# Patient Record
Sex: Male | Born: 1980 | Race: White | Hispanic: No | Marital: Married | Smoking: Current every day smoker
Health system: Southern US, Community
[De-identification: ages and names within clinical notes are randomized; demographics above are authoritative.]

---

## 2005-10-18 ENCOUNTER — Ambulatory Visit: Payer: Self-pay | Admitting: Family Medicine

## 2007-03-27 ENCOUNTER — Emergency Department (HOSPITAL_COMMUNITY): Admission: EM | Admit: 2007-03-27 | Discharge: 2007-03-28 | Payer: Self-pay | Admitting: Emergency Medicine

## 2007-07-03 ENCOUNTER — Emergency Department (HOSPITAL_COMMUNITY): Admission: EM | Admit: 2007-07-03 | Discharge: 2007-07-03 | Payer: Self-pay | Admitting: Emergency Medicine

## 2007-07-31 ENCOUNTER — Emergency Department (HOSPITAL_COMMUNITY): Admission: EM | Admit: 2007-07-31 | Discharge: 2007-08-01 | Payer: Self-pay | Admitting: Emergency Medicine

## 2007-09-07 ENCOUNTER — Emergency Department (HOSPITAL_COMMUNITY): Admission: EM | Admit: 2007-09-07 | Discharge: 2007-09-07 | Payer: Self-pay | Admitting: Emergency Medicine

## 2007-09-20 ENCOUNTER — Emergency Department (HOSPITAL_COMMUNITY): Admission: EM | Admit: 2007-09-20 | Discharge: 2007-09-20 | Payer: Self-pay | Admitting: Emergency Medicine

## 2007-10-21 ENCOUNTER — Emergency Department (HOSPITAL_COMMUNITY): Admission: EM | Admit: 2007-10-21 | Discharge: 2007-10-21 | Payer: Self-pay | Admitting: Emergency Medicine

## 2007-11-01 ENCOUNTER — Emergency Department (HOSPITAL_COMMUNITY): Admission: EM | Admit: 2007-11-01 | Discharge: 2007-11-01 | Payer: Self-pay | Admitting: Emergency Medicine

## 2007-11-30 ENCOUNTER — Emergency Department (HOSPITAL_COMMUNITY): Admission: EM | Admit: 2007-11-30 | Discharge: 2007-12-01 | Payer: Self-pay | Admitting: Emergency Medicine

## 2007-12-13 ENCOUNTER — Emergency Department (HOSPITAL_COMMUNITY): Admission: EM | Admit: 2007-12-13 | Discharge: 2007-12-13 | Payer: Self-pay | Admitting: Emergency Medicine

## 2007-12-14 ENCOUNTER — Emergency Department (HOSPITAL_COMMUNITY): Admission: EM | Admit: 2007-12-14 | Discharge: 2007-12-14 | Payer: Self-pay | Admitting: Emergency Medicine

## 2007-12-23 ENCOUNTER — Emergency Department (HOSPITAL_COMMUNITY): Admission: EM | Admit: 2007-12-23 | Discharge: 2007-12-23 | Payer: Self-pay | Admitting: Emergency Medicine

## 2008-01-12 ENCOUNTER — Emergency Department (HOSPITAL_COMMUNITY): Admission: EM | Admit: 2008-01-12 | Discharge: 2008-01-12 | Payer: Self-pay | Admitting: Emergency Medicine

## 2008-02-16 ENCOUNTER — Emergency Department (HOSPITAL_COMMUNITY): Admission: EM | Admit: 2008-02-16 | Discharge: 2008-02-16 | Payer: Self-pay | Admitting: Emergency Medicine

## 2008-02-26 ENCOUNTER — Emergency Department (HOSPITAL_COMMUNITY): Admission: EM | Admit: 2008-02-26 | Discharge: 2008-02-26 | Payer: Self-pay | Admitting: Emergency Medicine

## 2008-06-28 ENCOUNTER — Emergency Department (HOSPITAL_COMMUNITY): Admission: EM | Admit: 2008-06-28 | Discharge: 2008-06-29 | Payer: Self-pay | Admitting: Emergency Medicine

## 2008-07-01 ENCOUNTER — Emergency Department (HOSPITAL_COMMUNITY): Admission: EM | Admit: 2008-07-01 | Discharge: 2008-07-01 | Payer: Self-pay | Admitting: Emergency Medicine

## 2008-09-27 ENCOUNTER — Emergency Department (HOSPITAL_COMMUNITY): Admission: EM | Admit: 2008-09-27 | Discharge: 2008-09-28 | Payer: Self-pay | Admitting: Emergency Medicine

## 2008-10-21 ENCOUNTER — Emergency Department (HOSPITAL_COMMUNITY): Admission: EM | Admit: 2008-10-21 | Discharge: 2008-10-21 | Payer: Self-pay | Admitting: Emergency Medicine

## 2008-10-24 ENCOUNTER — Emergency Department (HOSPITAL_COMMUNITY): Admission: EM | Admit: 2008-10-24 | Discharge: 2008-10-24 | Payer: Self-pay | Admitting: Emergency Medicine

## 2008-11-07 ENCOUNTER — Emergency Department (HOSPITAL_COMMUNITY): Admission: EM | Admit: 2008-11-07 | Discharge: 2008-11-07 | Payer: Self-pay | Admitting: Emergency Medicine

## 2008-11-17 ENCOUNTER — Emergency Department (HOSPITAL_COMMUNITY): Admission: EM | Admit: 2008-11-17 | Discharge: 2008-11-17 | Payer: Self-pay | Admitting: Emergency Medicine

## 2008-11-27 ENCOUNTER — Emergency Department (HOSPITAL_COMMUNITY): Admission: EM | Admit: 2008-11-27 | Discharge: 2008-11-27 | Payer: Self-pay | Admitting: Emergency Medicine

## 2008-11-29 ENCOUNTER — Emergency Department (HOSPITAL_COMMUNITY): Admission: EM | Admit: 2008-11-29 | Discharge: 2008-11-29 | Payer: Self-pay | Admitting: Emergency Medicine

## 2009-01-26 ENCOUNTER — Emergency Department (HOSPITAL_COMMUNITY): Admission: EM | Admit: 2009-01-26 | Discharge: 2009-01-26 | Payer: Self-pay | Admitting: Emergency Medicine

## 2009-01-29 ENCOUNTER — Emergency Department (HOSPITAL_COMMUNITY): Admission: EM | Admit: 2009-01-29 | Discharge: 2009-01-29 | Payer: Self-pay | Admitting: Emergency Medicine

## 2009-02-20 ENCOUNTER — Emergency Department (HOSPITAL_COMMUNITY): Admission: EM | Admit: 2009-02-20 | Discharge: 2009-02-20 | Payer: Self-pay | Admitting: Emergency Medicine

## 2009-06-25 ENCOUNTER — Emergency Department (HOSPITAL_COMMUNITY): Admission: EM | Admit: 2009-06-25 | Discharge: 2009-06-25 | Payer: Self-pay | Admitting: Emergency Medicine

## 2009-09-07 ENCOUNTER — Emergency Department (HOSPITAL_COMMUNITY): Admission: EM | Admit: 2009-09-07 | Discharge: 2009-09-07 | Payer: Self-pay | Admitting: Emergency Medicine

## 2010-12-07 ENCOUNTER — Emergency Department (HOSPITAL_COMMUNITY)
Admission: EM | Admit: 2010-12-07 | Discharge: 2010-12-08 | Disposition: A | Discharge status: Home / Self Care | Payer: BC Managed Care – PPO | Attending: Emergency Medicine | Admitting: Emergency Medicine

## 2010-12-07 DIAGNOSIS — K089 Disorder of teeth and supporting structures, unspecified: Secondary | ICD-10-CM | POA: Insufficient documentation

## 2010-12-07 DIAGNOSIS — K0381 Cracked tooth: Secondary | ICD-10-CM | POA: Insufficient documentation

## 2010-12-07 DIAGNOSIS — R220 Localized swelling, mass and lump, head: Secondary | ICD-10-CM | POA: Insufficient documentation

## 2010-12-07 DIAGNOSIS — R221 Localized swelling, mass and lump, neck: Secondary | ICD-10-CM | POA: Insufficient documentation

## 2010-12-07 DIAGNOSIS — K047 Periapical abscess without sinus: Secondary | ICD-10-CM | POA: Insufficient documentation

## 2011-01-17 IMAGING — CR DG SHOULDER 2+V*L*
3 series · 3 of 3 positions shown · non-contrast
Comparison: None

CLINICAL DATA: Go-kart accident.  Left shoulder pain.

LEFT SHOULDER - 2+ VIEW

[view not recorded (1 of 3)]
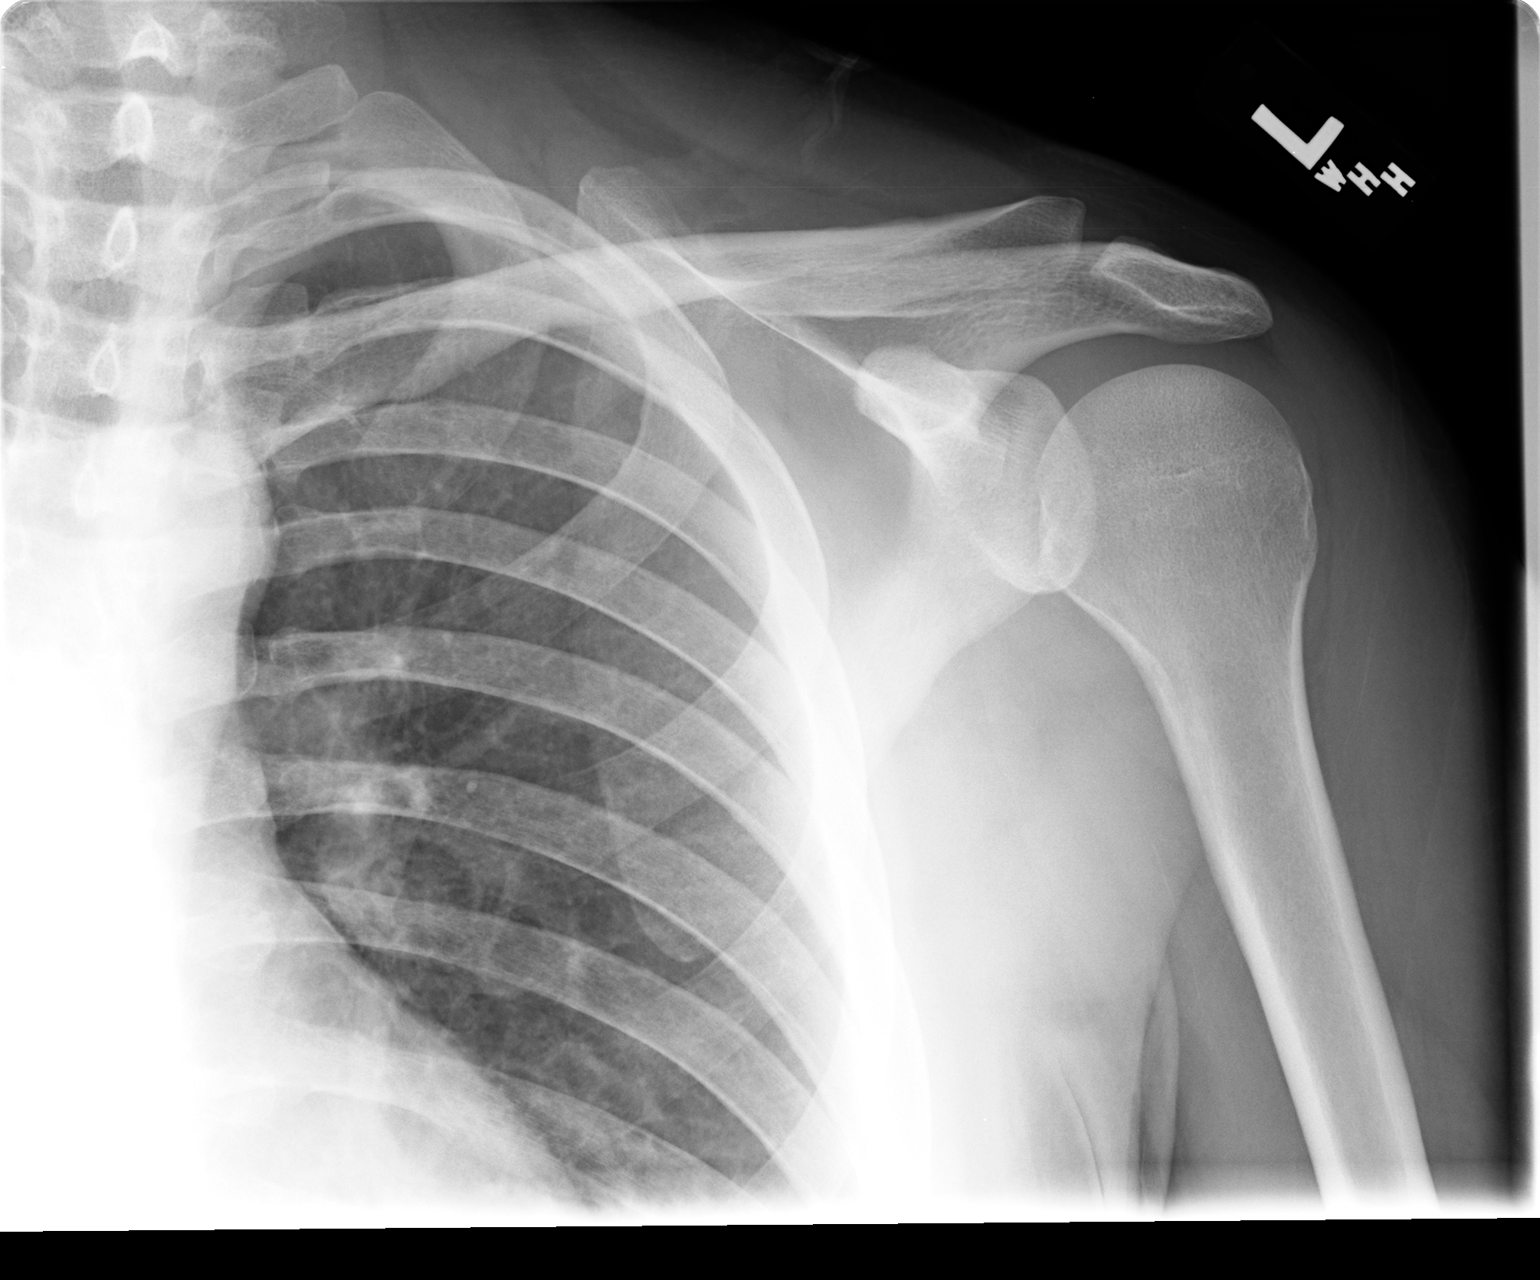

[view not recorded (2 of 3)]
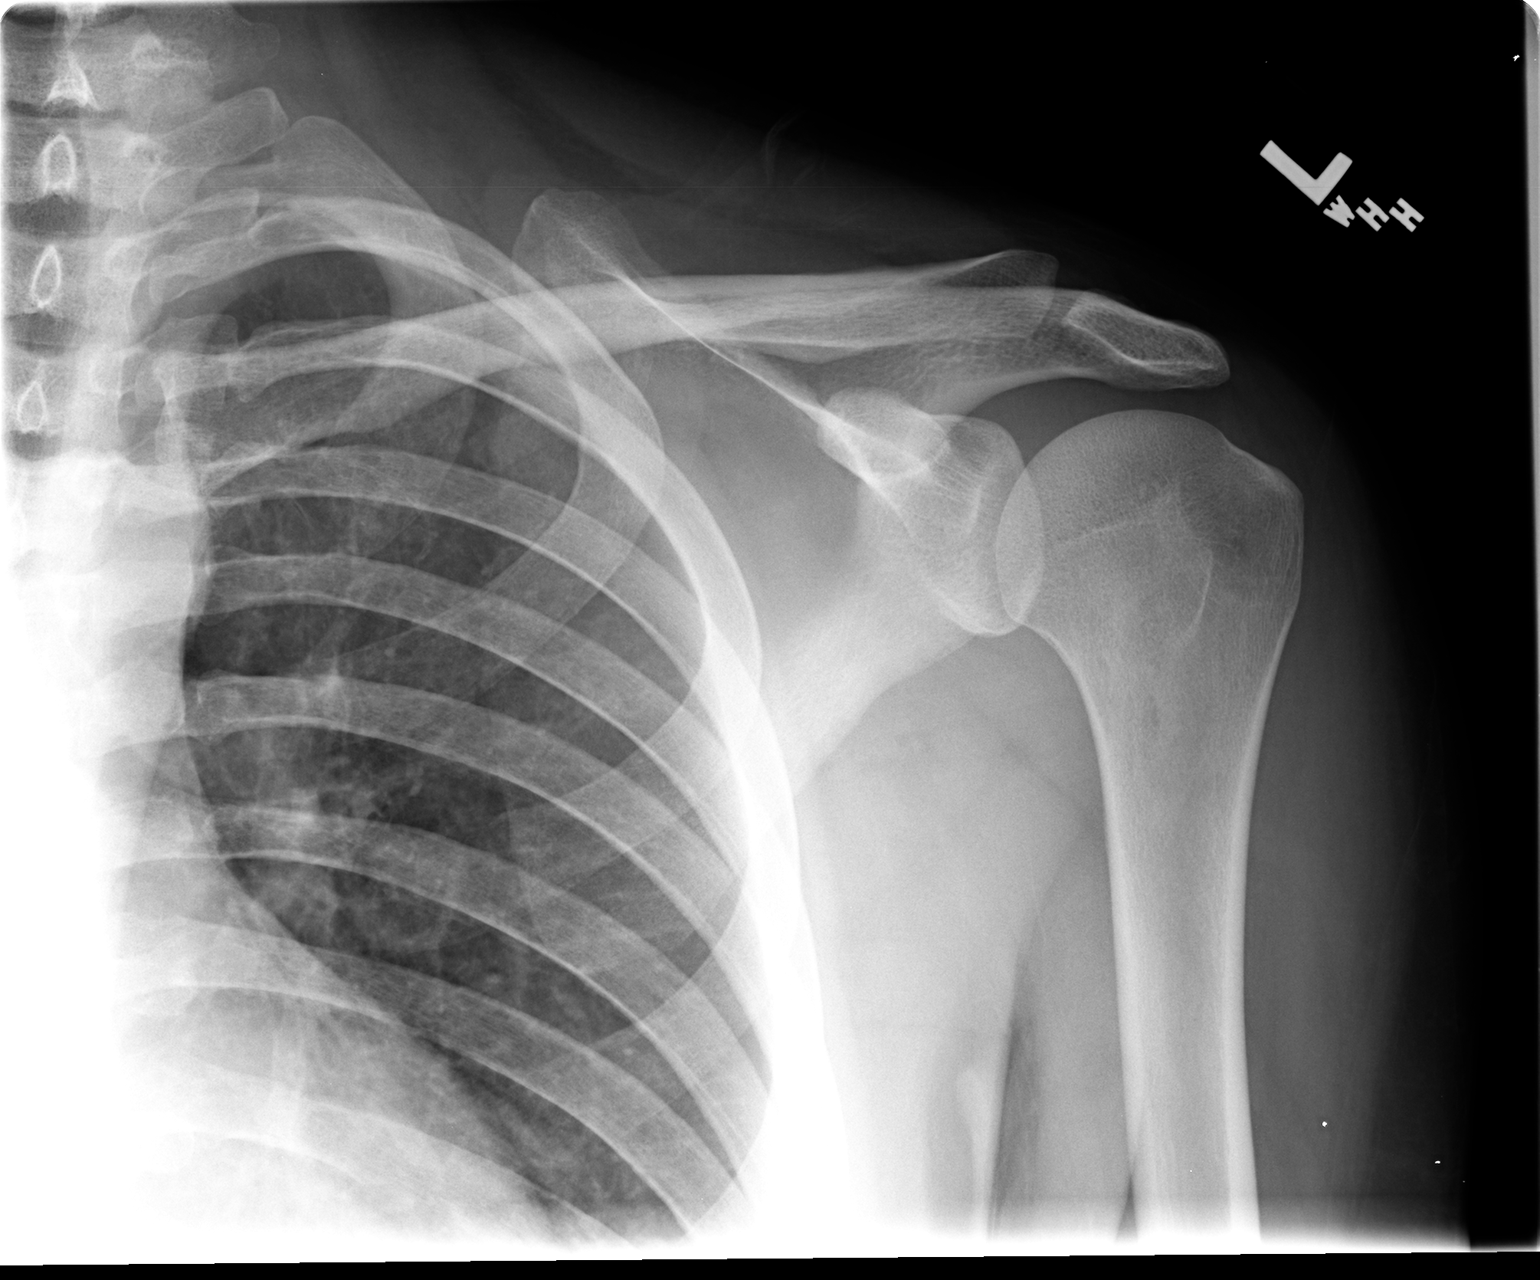

[view not recorded (3 of 3)]
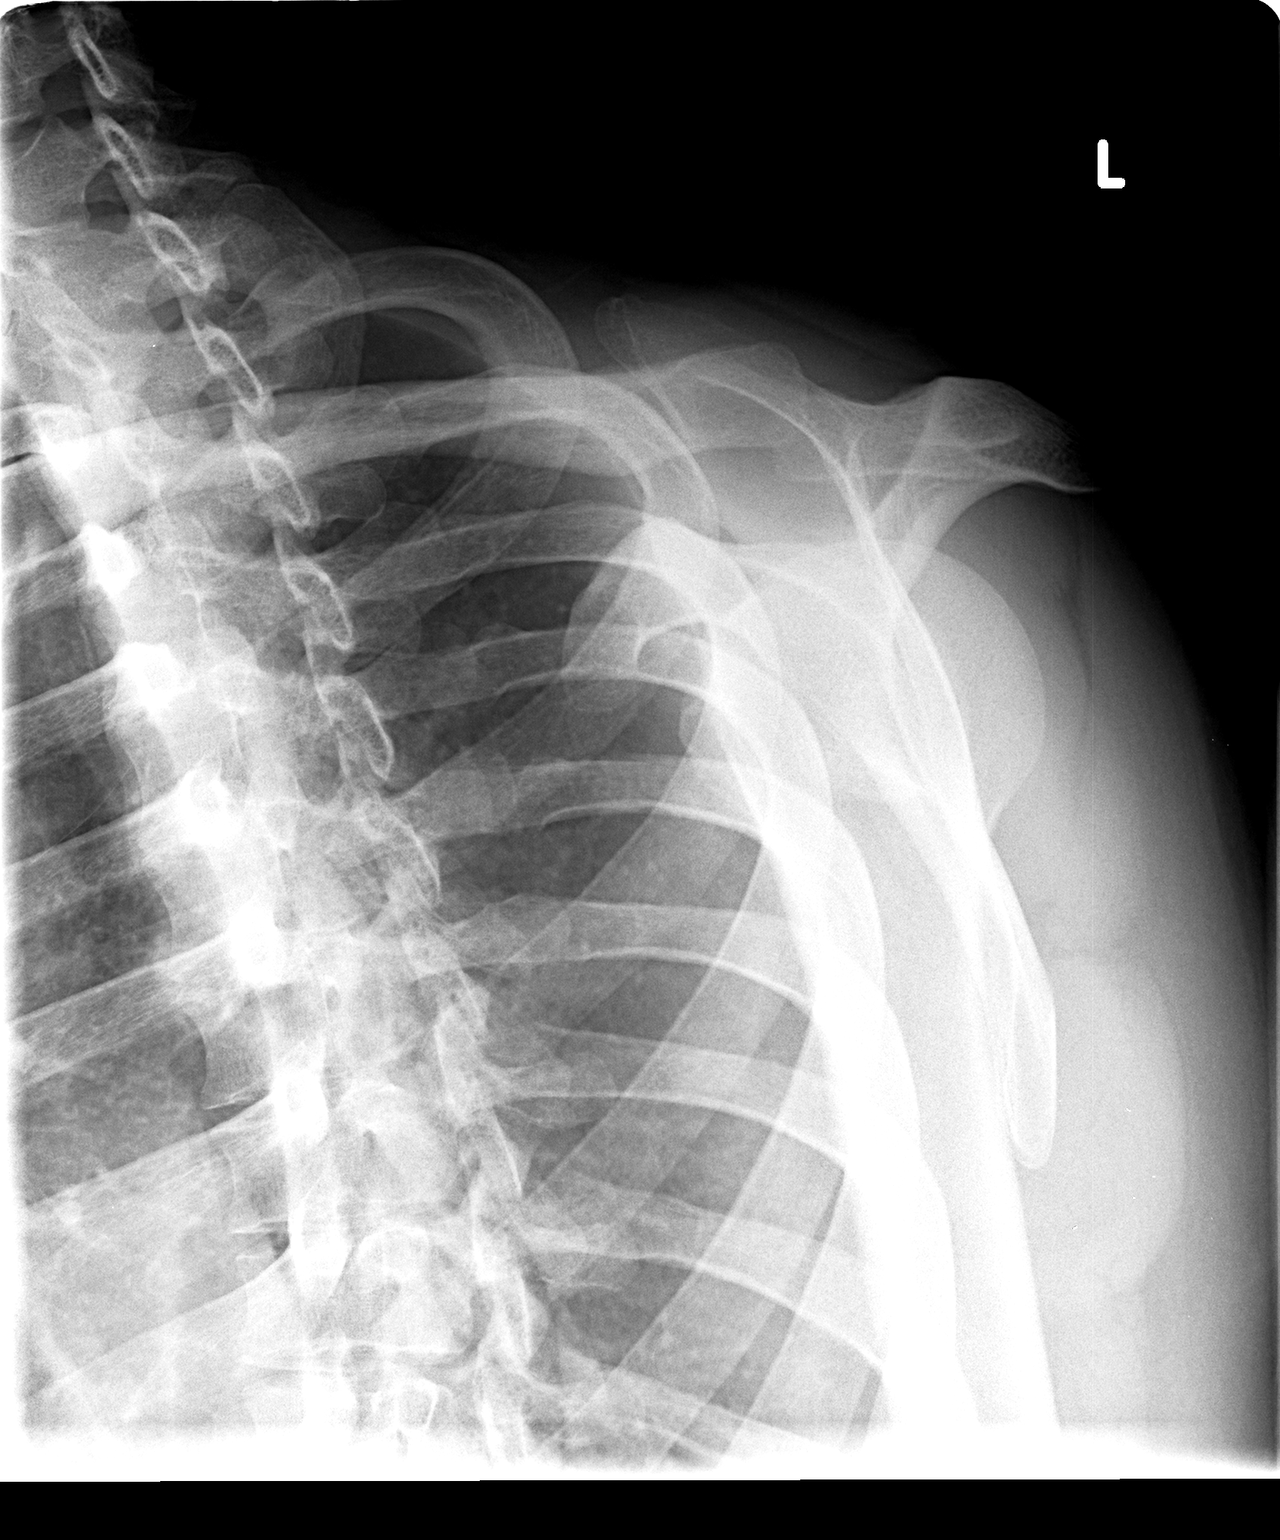

[3 of 3 positions shown; findings below may reference images not displayed]

FINDINGS: No fracture, foreign body, or acute bony findings are
identified. No dislocation evident.
IMPRESSION: 1.  No acute findings.

## 2011-01-17 IMAGING — CT CT HEAD W/O CM
1 series · 16 of 30 positions shown, 20 images · non-contrast
Comparison: none

CLINICAL DATA: Football injury.  Injury to the head.  Headache.

CT HEAD WITHOUT CONTRAST
TECHNIQUE: Contiguous axial images were obtained from the base of
the skull through the vertex without contrast.

[Series 2: headtrauma 4.8 h37s · axial · 0.46mm/px · z∈[+1085,+1246]mm · 16 of 36 slices shown, 20 images]
[im 2/36  brain]
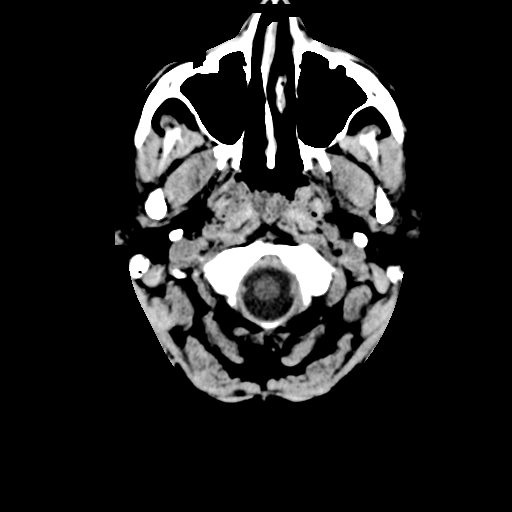
[im 2/36  bone]
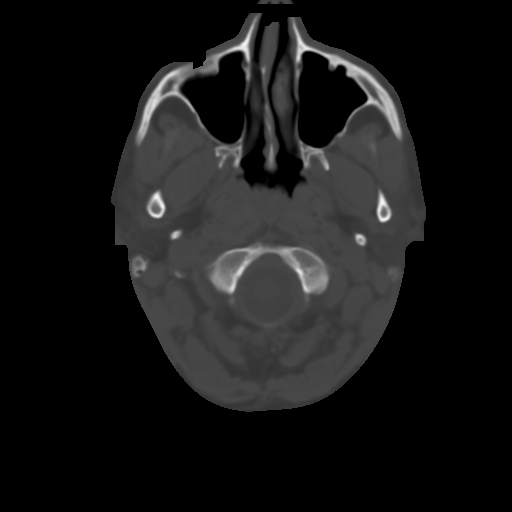
[im 4/36  brain]
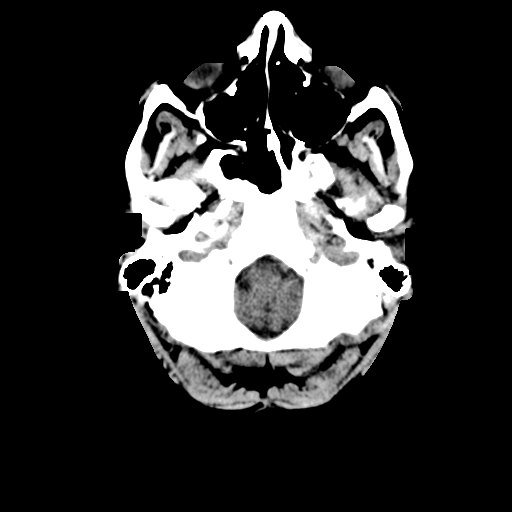
[im 7/36  brain]
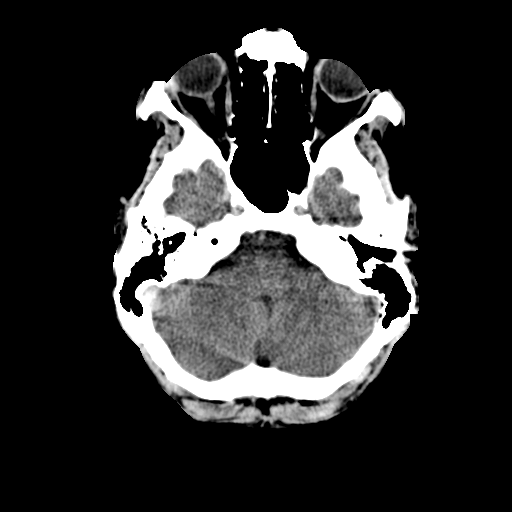
[im 9/36  brain]
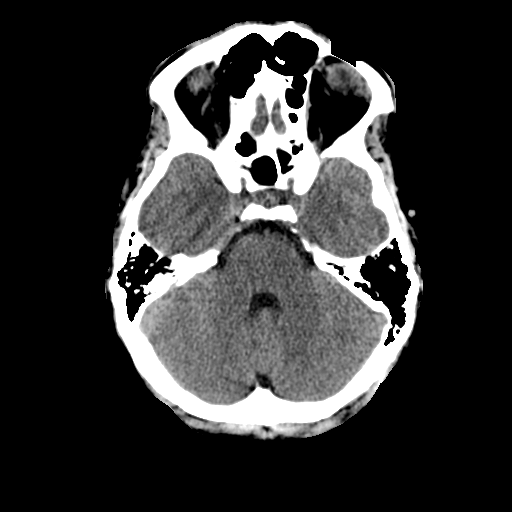
[im 10/36  brain]
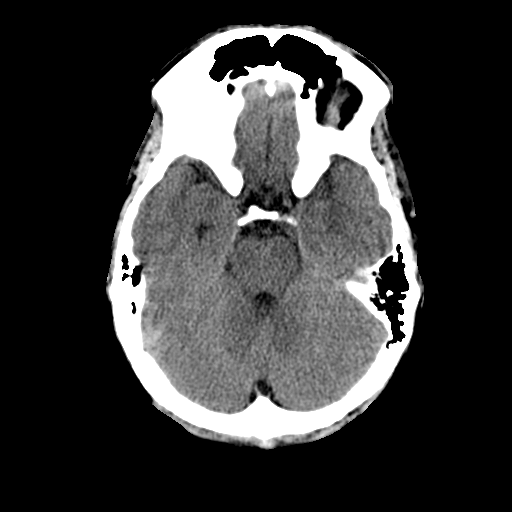
[im 10/36  bone]
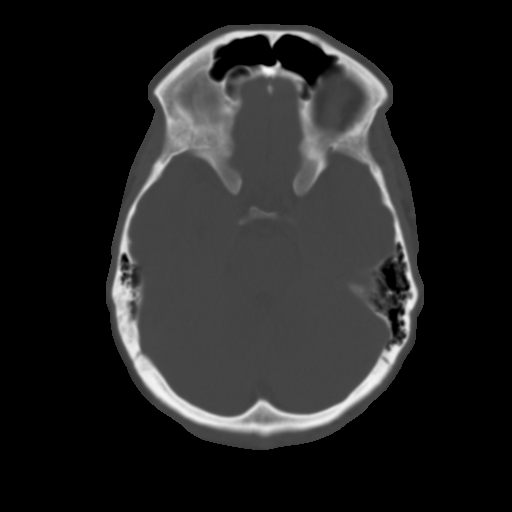
[im 13/36  brain]
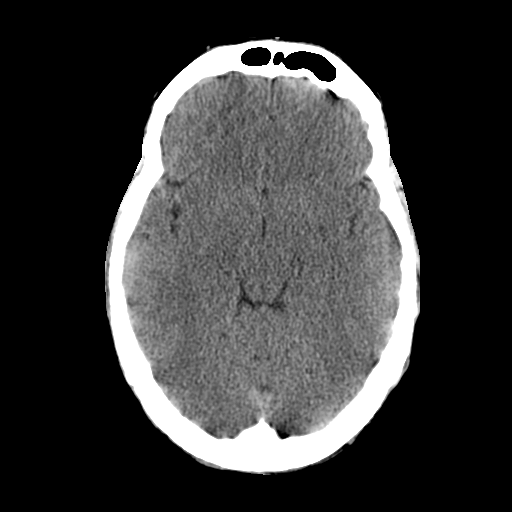
[im 15/36  brain]
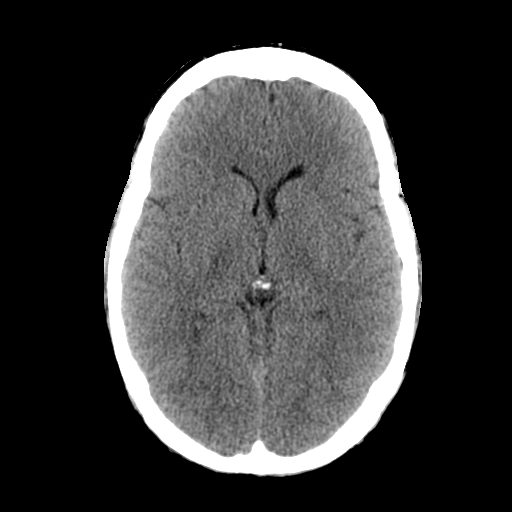
[im 17/36  brain]
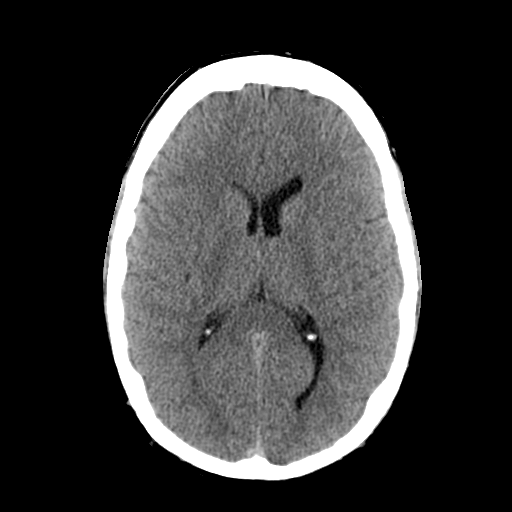
[im 19/36  brain]
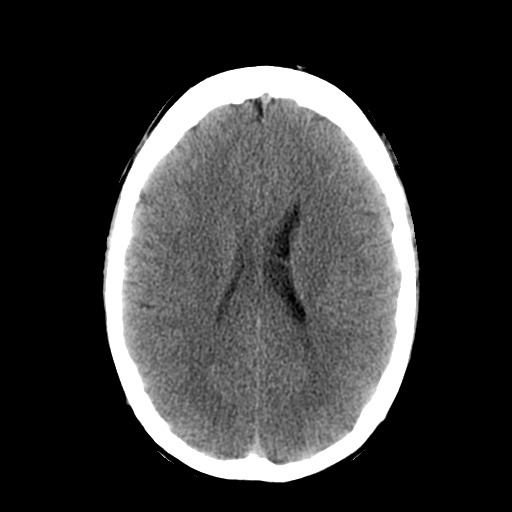
[im 19/36  bone]
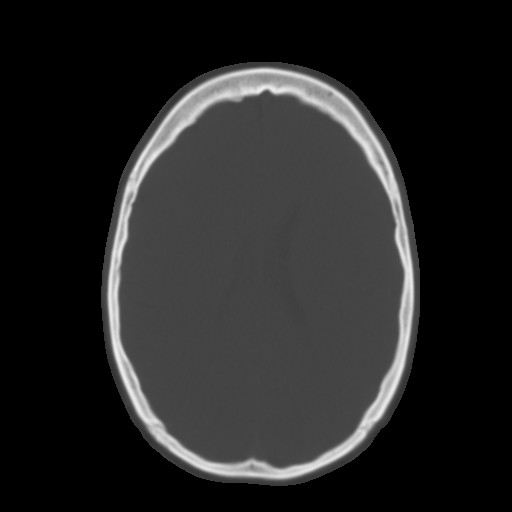
[im 21/36  brain]
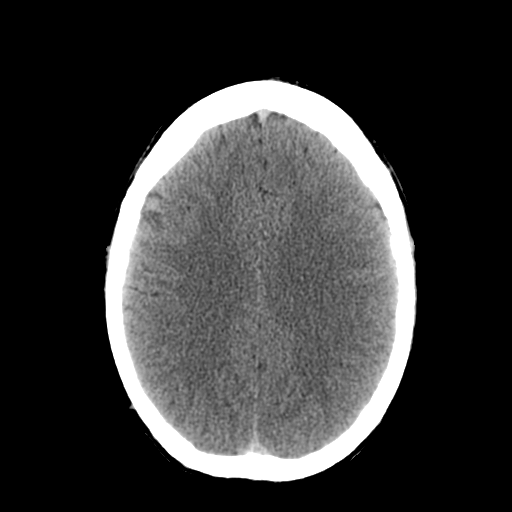
[im 23/36  brain]
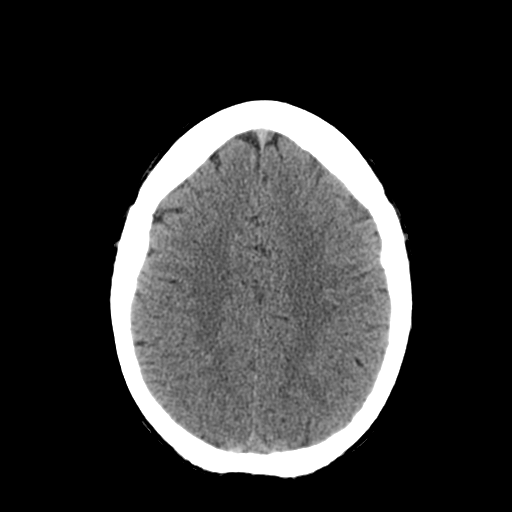
[im 26/36  brain]
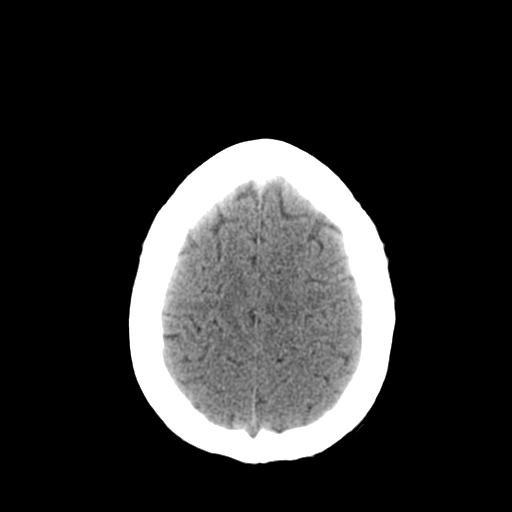
[im 27/36  brain]
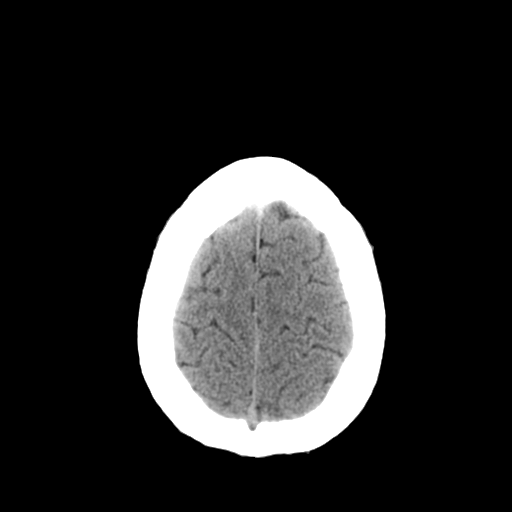
[im 27/36  bone]
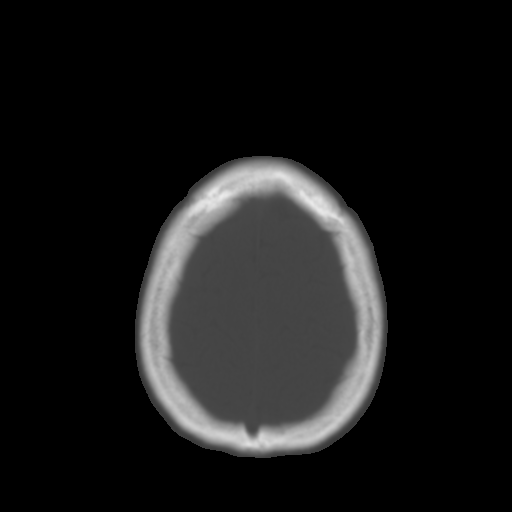
[im 29/36  brain]
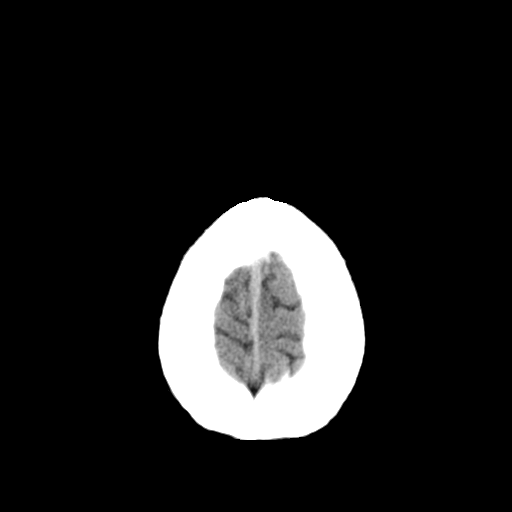
[im 32/36  brain]
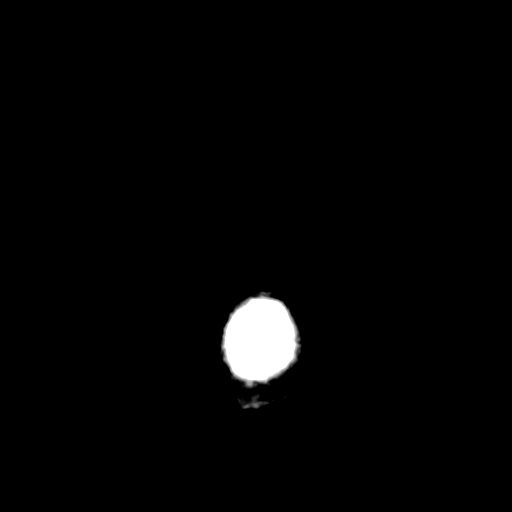
[im 34/36  brain]
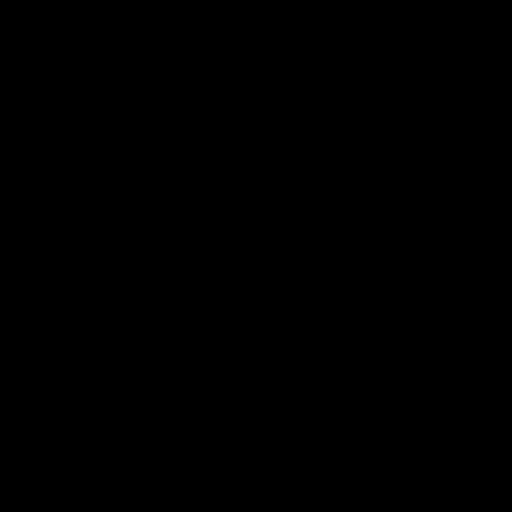

[16 of 30 positions shown; findings below may reference images not displayed]

FINDINGS: The brain stem, cerebellum, cerebral peduncles, thalami,
basal ganglia, basilar cisterns, and ventricular system appear
unremarkable.

No intracranial hemorrhage, mass lesion, or acute infarction is
identified.
IMPRESSION: 1.  No acute intracranial findings.

## 2011-05-06 LAB — CULTURE, ROUTINE-ABSCESS

## 2013-01-25 ENCOUNTER — Encounter (HOSPITAL_COMMUNITY): Payer: Self-pay | Admitting: *Deleted

## 2013-01-25 ENCOUNTER — Emergency Department (HOSPITAL_COMMUNITY)
Admission: EM | Admit: 2013-01-25 | Discharge: 2013-01-25 | Disposition: A | Discharge status: Home / Self Care | Payer: BC Managed Care – PPO | Attending: Emergency Medicine | Admitting: Emergency Medicine

## 2013-01-25 DIAGNOSIS — Y9289 Other specified places as the place of occurrence of the external cause: Secondary | ICD-10-CM | POA: Insufficient documentation

## 2013-01-25 DIAGNOSIS — M25559 Pain in unspecified hip: Secondary | ICD-10-CM | POA: Insufficient documentation

## 2013-01-25 DIAGNOSIS — F17200 Nicotine dependence, unspecified, uncomplicated: Secondary | ICD-10-CM | POA: Insufficient documentation

## 2013-01-25 DIAGNOSIS — Y939 Activity, unspecified: Secondary | ICD-10-CM | POA: Insufficient documentation

## 2013-01-25 DIAGNOSIS — Y990 Civilian activity done for income or pay: Secondary | ICD-10-CM | POA: Insufficient documentation

## 2013-01-25 DIAGNOSIS — W92XXXA Exposure to excessive heat of man-made origin, initial encounter: Secondary | ICD-10-CM | POA: Insufficient documentation

## 2013-01-25 DIAGNOSIS — R5383 Other fatigue: Secondary | ICD-10-CM

## 2013-01-25 DIAGNOSIS — R5381 Other malaise: Secondary | ICD-10-CM | POA: Insufficient documentation

## 2013-01-25 DIAGNOSIS — T675XXA Heat exhaustion, unspecified, initial encounter: Secondary | ICD-10-CM | POA: Insufficient documentation

## 2013-01-25 LAB — CBC WITH DIFFERENTIAL/PLATELET
Hemoglobin: 14.1 g/dL (ref 13.0–17.0)
Lymphocytes Relative: 18 % (ref 12–46)
Lymphs Abs: 1.6 10*3/uL (ref 0.7–4.0)
Monocytes Relative: 3 % (ref 3–12)
Neutro Abs: 6.6 10*3/uL (ref 1.7–7.7)
Neutrophils Relative %: 78 % — ABNORMAL HIGH (ref 43–77)
RBC: 4.57 MIL/uL (ref 4.22–5.81)
WBC: 8.5 10*3/uL (ref 4.0–10.5)

## 2013-01-25 LAB — COMPREHENSIVE METABOLIC PANEL
ALT: 17 U/L (ref 0–53)
Albumin: 4 g/dL (ref 3.5–5.2)
Alkaline Phosphatase: 66 U/L (ref 39–117)
BUN: 13 mg/dL (ref 6–23)
Chloride: 103 mEq/L (ref 96–112)
GFR calc Af Amer: >90 mL/min (ref >90)
Glucose, Bld: 121 mg/dL — ABNORMAL HIGH (ref 70–99)
Potassium: 3.2 mEq/L — ABNORMAL LOW (ref 3.5–5.1)
Total Bilirubin: 0.3 mg/dL (ref 0.3–1.2)

## 2013-01-25 LAB — URINALYSIS, ROUTINE W REFLEX MICROSCOPIC
Bilirubin Urine: NEGATIVE
Glucose, UA: NEGATIVE mg/dL
Ketones, ur: NEGATIVE mg/dL
Leukocytes, UA: NEGATIVE
Protein, ur: NEGATIVE mg/dL

## 2013-01-25 MED ORDER — SODIUM CHLORIDE 0.9 % IV BOLUS (SEPSIS)
1000.0000 mL | Freq: Once | INTRAVENOUS | Status: AC
  Administered 2013-01-25: 1000 mL via INTRAVENOUS

## 2013-01-25 NOTE — ED Notes (Signed)
Patient states he does not need anything at this time. 

## 2013-01-25 NOTE — ED Provider Notes (Signed)
History  This chart was scribed for Eric Roller, MD by Manuela Schwartz, ED scribe. This patient was seen in room [facility]12/[facility]12 and the patient's care was started at 1819.   CSN: [id number]  Arrival date & time 01/25/13  1819   First MD Initiated Contact with Patient 01/25/13 1839      Chief Complaint  Patient presents with  . Weakness  . Hip Pain   Patient is a 32 y.o. male presenting with weakness and hip pain. The history is provided by the patient. No language interpreter was used.  Weakness This is a new problem. The current episode started more than 2 days ago. The problem occurs constantly. The problem has been gradually worsening. Pertinent negatives include no chest pain, no abdominal pain and no shortness of breath. The symptoms are aggravated by stress. Nothing relieves the symptoms. He has tried rest for the symptoms. The treatment provided no relief.  Hip Pain Pertinent negatives include no chest pain, no abdominal pain and no shortness of breath.  HPI Comments: Eric Meyer is a 32 y.o. male who presents to the Emergency Department complaining of constant gradually worsening generalized weakness/fatigue and mild left hip pain. He states works in [employer] w/out a/c and hot/sweating all the time and feeling dehydrated. He missed work the past two days due to fatigue and slept all day yesterday. He c/o left hip pain but denies any recent trauma to his hip. He denies any changes to BMs. He is a smoker and denies illicit drug use. He denies any recent regular medicine changes.    History reviewed. No pertinent past medical history.  History reviewed. No pertinent past surgical history.  History reviewed. No pertinent family history.  History  Substance Use Topics  . Smoking status: Current Every Day Smoker  . Smokeless tobacco: Not on file  . Alcohol Use: No      Review of Systems  Constitutional: Positive for fatigue. Negative for fever, chills and appetite change.   Respiratory: Negative for shortness of breath.   Cardiovascular: Negative for chest pain.  Gastrointestinal: Negative for nausea, vomiting, abdominal pain and diarrhea.  Musculoskeletal:       Left hip pain  Neurological: Negative for weakness.  All other systems reviewed and are negative.    Allergies  Review of patient's allergies indicates no known allergies.  Home Medications   Current Outpatient Rx  Name  Route  Sig  Dispense  Refill  . ibuprofen (ADVIL,MOTRIN) 200 MG tablet   Oral   Take 800 mg by mouth every 6 (six) hours as needed for pain.           Triage Vitals: BP 154/83  Pulse 95  Temp(Src) 99.3 F (37.4 C) (Oral)  Resp 20  Ht 5\' 5"  (1.651 m)  Wt 166 lb (75.297 kg)  BMI 27.62 kg/m2  SpO2 100%  Physical Exam  Nursing note and vitals reviewed. Constitutional: He is oriented to person, place, and time. He appears well-developed and well-nourished. No distress.  HENT:  Head: Normocephalic and atraumatic.  Dry mucous membranes  Eyes: EOM are normal.  Neck: Neck supple. No tracheal deviation present.  Cardiovascular: Normal rate.   Pulmonary/Chest: Effort normal. No respiratory distress.  Musculoskeletal: Normal range of motion.  Neurological: He is alert and oriented to person, place, and time.  Skin: Skin is warm and dry.  Psychiatric: He has a normal mood and affect. His behavior is normal.    ED Course  Procedures (including critical  care time) DIAGNOSTIC STUDIES: Oxygen Saturation is 100% on room air, normal by my interpretation.    COORDINATION OF CARE: At 655 PM Discussed treatment plan with patient which includes IV fluids, blood work. Patient agrees.   Labs Reviewed  CBC WITH DIFFERENTIAL - Abnormal; Notable for the following:    Platelets 147 (*)    Neutrophils Relative % 78 (*)    All other components within normal limits  COMPREHENSIVE METABOLIC PANEL - Abnormal; Notable for the following:    Potassium 3.2 (*)    Glucose, Bld 121  (*)    All other components within normal limits  URINALYSIS, ROUTINE W REFLEX MICROSCOPIC - Abnormal; Notable for the following:    Specific Gravity, Urine <1.005 (*)    All other components within normal limits  CK - Abnormal; Notable for the following:    Total CK 810 (*)    All other components within normal limits   No results found.   1. Fatigue   2. Heat exhaustion, initial encounter       MDM  Pt well appearing, no focal neuro def, normal strenght, normal VS, no fever, soft abd, clear lungs and no rashes.  Labs unremarkable - fluids given, pt table for d/c.  Labs unremarkable.   I personally performed the services described in this documentation, which was scribed in my presence. The recorded information has been reviewed and is accurate.  Meds given in ED:  Medications  sodium chloride 0.9 % bolus 1,000 mL (1,000 mLs Intravenous New Bag/Given 01/25/13 1930)  sodium chloride 0.9 % bolus 1,000 mL (0 mLs Intravenous Stopped 01/25/13 2028)    New Prescriptions   No medications on file             Eric Roller, MD 01/25/13 2123

## 2013-01-25 NOTE — ED Notes (Signed)
MD at bedside. 

## 2013-01-25 NOTE — ED Notes (Signed)
Pt states all he wants to do is sleep. He has been asleep since yesterday morning. Pt has decreased apetitte but has been taking fluids. Pt also c/o left hip pain.

## 2014-10-25 ENCOUNTER — Emergency Department (HOSPITAL_COMMUNITY)
Admission: EM | Admit: 2014-10-25 | Discharge: 2014-10-25 | Disposition: A | Discharge status: Home / Self Care | Payer: PRIVATE HEALTH INSURANCE | Attending: Emergency Medicine | Admitting: Emergency Medicine

## 2014-10-25 ENCOUNTER — Encounter (HOSPITAL_COMMUNITY): Payer: Self-pay | Admitting: *Deleted

## 2014-10-25 DIAGNOSIS — R000 Tachycardia, unspecified: Secondary | ICD-10-CM | POA: Insufficient documentation

## 2014-10-25 DIAGNOSIS — Z720 Tobacco use: Secondary | ICD-10-CM | POA: Diagnosis not present

## 2014-10-25 DIAGNOSIS — K088 Other specified disorders of teeth and supporting structures: Secondary | ICD-10-CM | POA: Diagnosis present

## 2014-10-25 DIAGNOSIS — K029 Dental caries, unspecified: Secondary | ICD-10-CM | POA: Diagnosis not present

## 2014-10-25 MED ORDER — AMOXICILLIN 250 MG PO CAPS
500.0000 mg | ORAL_CAPSULE | Freq: Once | ORAL | Status: AC
  Administered 2014-10-25: 500 mg via ORAL
  Filled 2014-10-25: qty 2

## 2014-10-25 MED ORDER — IBUPROFEN 800 MG PO TABS: 800.0000 mg | ORAL_TABLET | Freq: Three times a day (TID) | ORAL | Status: AC

## 2014-10-25 MED ORDER — IBUPROFEN 800 MG PO TABS
800.0000 mg | ORAL_TABLET | Freq: Once | ORAL | Status: AC
  Administered 2014-10-25: 800 mg via ORAL
  Filled 2014-10-25: qty 1

## 2014-10-25 MED ORDER — ACETAMINOPHEN 325 MG PO TABS
650.0000 mg | ORAL_TABLET | Freq: Once | ORAL | Status: AC
  Administered 2014-10-25: 650 mg via ORAL
  Filled 2014-10-25: qty 2

## 2014-10-25 MED ORDER — AMOXICILLIN 500 MG PO CAPS: 500.0000 mg | ORAL_CAPSULE | Freq: Three times a day (TID) | ORAL | Status: DC

## 2014-10-25 NOTE — ED Notes (Signed)
Pt alert & oriented x4, stable gait. Patient given discharge instructions, paperwork & prescription(s). Patient  instructed to stop at the registration desk to finish any additional paperwork. Patient verbalized understanding. Pt left department w/ no further questions. 

## 2014-10-25 NOTE — Discharge Instructions (Signed)
Dental Caries °Dental caries is tooth decay. This decay can cause a hole in teeth (cavity) that can get bigger and deeper over time. °HOME CARE °· Brush and floss your teeth. Do this at least two times a day. °· Use a fluoride toothpaste. °· Use a mouth rinse if told by your dentist or doctor. °· Eat less sugary and starchy foods. Drink less sugary drinks. °· Avoid snacking often on sugary and starchy foods. Avoid sipping often on sugary drinks. °· Keep regular checkups and cleanings with your dentist. °· Use fluoride supplements if told by your dentist or doctor. °· Allow fluoride to be applied to teeth if told by your dentist or doctor. °Document Released: 05/04/2008 Document Revised: 12/10/2013 Document Reviewed: 07/28/2012 °[employer]® Patient Information ©2015 [employer]. This information is not intended to replace advice given to you by your health care provider. Make sure you discuss any questions you have with your health care provider. ° °

## 2014-10-25 NOTE — ED Notes (Signed)
Broke bottom left tooth yesterday.  Pain 5/10 since.  Has taken ibuprofen and tylenol.

## 2014-10-25 NOTE — ED Provider Notes (Signed)
CSN: [id number]     Arrival date & time 10/25/14  1707 History   First MD Initiated Contact with Patient 10/25/14 1833     Chief Complaint  Patient presents with  . Dental Pain     (Consider location/radiation/quality/duration/timing/severity/associated sxs/prior Treatment) Patient is a 34 y.o. male presenting with tooth pain. The history is provided by the patient.  Dental Pain Location:  Lower Associated symptoms: no neck pain     History reviewed. No pertinent past medical history. History reviewed. No pertinent past surgical history. History reviewed. No pertinent family history. History  Substance Use Topics  . Smoking status: Current Every Day Smoker -- 1.00 packs/day    Types: Cigarettes  . Smokeless tobacco: Not on file  . Alcohol Use: No    Review of Systems  Constitutional: Negative for activity change.       All ROS Neg except as noted in HPI  HENT: Positive for dental problem. Negative for nosebleeds.   Eyes: Negative for photophobia and discharge.  Respiratory: Negative for cough, shortness of breath and wheezing.   Cardiovascular: Negative for chest pain and palpitations.  Gastrointestinal: Negative for abdominal pain and blood in stool.  Genitourinary: Negative for dysuria, frequency and hematuria.  Musculoskeletal: Negative for back pain, arthralgias and neck pain.  Skin: Negative.   Neurological: Negative for dizziness, seizures and speech difficulty.  Psychiatric/Behavioral: Negative for hallucinations and confusion.      Allergies  Review of patient's allergies indicates no known allergies.  Home Medications   Prior to Admission medications   Medication Sig Start Date End Date Taking? Authorizing Provider  aspirin-acetaminophen-caffeine (EXCEDRIN MIGRAINE) [phone] MG per tablet Take 2 tablets by mouth every 6 (six) hours as needed (pain).   Yes Historical Provider, MD  ibuprofen (ADVIL,MOTRIN) 200 MG tablet Take 800 mg by mouth every 6 (six)  hours as needed for pain.   Yes Historical Provider, MD   BP 143/86 mmHg  Pulse 111  Temp(Src) 98.9 F (37.2 C) (Oral)  Resp 14  Ht  (1.727 m)  Wt 164 lb 9 oz (74.645 kg)  BMI 25.03 kg/m2  SpO2 100% Physical Exam  Constitutional: He is oriented to person, place, and time. He appears well-developed and well-nourished.  Non-toxic appearance.  HENT:  Head: Normocephalic.  Right Ear: Tympanic membrane and external ear normal.  Left Ear: Tympanic membrane and external ear normal.  Tooth 20 - #27 are decayed to the gum line. The gum area near #22 and 23 swollen. I do not see an abscess. There is no swelling under the tongue. The airway is patent.  Eyes: EOM and lids are normal. Pupils are equal, round, and reactive to light.  Neck: Normal range of motion. Neck supple. Carotid bruit is not present.  Cardiovascular: Regular rhythm, normal heart sounds, intact distal pulses and normal pulses.  Tachycardia present.   Pulmonary/Chest: Breath sounds normal. No respiratory distress.  Abdominal: Soft. Bowel sounds are normal. There is no tenderness. There is no guarding.  Musculoskeletal: Normal range of motion.  Lymphadenopathy:       Head (right side): No submandibular adenopathy present.       Head (left side): No submandibular adenopathy present.    He has no cervical adenopathy.  Neurological: He is alert and oriented to person, place, and time. He has normal strength. No cranial nerve deficit or sensory deficit.  Skin: Skin is warm and dry.  Psychiatric: He has a normal mood and affect. His speech is normal.  Nursing note and vitals reviewed.   ED Course  Procedures (including critical care time) Labs Review Labs Reviewed - No data to display  Imaging Review No results found.   EKG Interpretation None      MDM  Pt has extensive dental issues. Discussed need to see a dentist as soon as possible. No abscess. No evidence for Ludwig's Angina. Airway patent.   Final  diagnoses:  None    *I have reviewed nursing notes, vital signs, and all appropriate lab and imaging results for this patient.Ivery Quale**    Taunya Goral, PA-C 10/29/14 1455  Linwood DibblesJon Knapp, MD 10/30/14 2107

## 2016-07-02 ENCOUNTER — Encounter (HOSPITAL_COMMUNITY): Payer: Self-pay | Admitting: *Deleted

## 2016-07-02 ENCOUNTER — Emergency Department (HOSPITAL_COMMUNITY)
Admission: EM | Admit: 2016-07-02 | Discharge: 2016-07-02 | Disposition: A | Discharge status: Home / Self Care | Payer: PRIVATE HEALTH INSURANCE | Attending: Emergency Medicine | Admitting: Emergency Medicine

## 2016-07-02 DIAGNOSIS — F17210 Nicotine dependence, cigarettes, uncomplicated: Secondary | ICD-10-CM | POA: Insufficient documentation

## 2016-07-02 DIAGNOSIS — L0231 Cutaneous abscess of buttock: Secondary | ICD-10-CM | POA: Insufficient documentation

## 2016-07-02 DIAGNOSIS — L0291 Cutaneous abscess, unspecified: Secondary | ICD-10-CM

## 2016-07-02 MED ORDER — LIDOCAINE HCL (PF) 2 % IJ SOLN
INTRAMUSCULAR | Status: AC
  Administered 2016-07-02: 10 mL
  Filled 2016-07-02: qty 10

## 2016-07-02 MED ORDER — HYDROCODONE-ACETAMINOPHEN 5-325 MG PO TABS: 2.0000 | ORAL_TABLET | ORAL | 0 refills | Status: AC | PRN

## 2016-07-02 MED ORDER — SULFAMETHOXAZOLE-TRIMETHOPRIM 800-160 MG PO TABS: 1.0000 | ORAL_TABLET | Freq: Two times a day (BID) | ORAL | 0 refills | Status: AC

## 2016-07-02 MED ORDER — LIDOCAINE HCL (PF) 2 % IJ SOLN
10.0000 mL | Freq: Once | INTRAMUSCULAR | Status: AC
  Administered 2016-07-02: 10 mL

## 2016-07-02 NOTE — ED Notes (Signed)
Suture tray at bedside.  

## 2016-07-02 NOTE — ED Triage Notes (Signed)
Pt comes in with abscess on upper buttocks. Area has defined edges. Denies any n/v/d or fevers.

## 2016-07-02 NOTE — ED Provider Notes (Signed)
[facility]-EMERGENCY DEPT Provider Note   CSN: [id number] Arrival date & time: 07/02/16  1702     History   Chief Complaint Chief Complaint  Patient presents with  . Abscess    HPI Eric Meyer is a 35 y.o. male.  The history is provided by the patient. No language interpreter was used.  Abscess  Location:  Torso Torso abscess location:  L flank Abscess quality: painful, redness and warmth   Red streaking: no   Duration:  3 days Progression:  Worsening Pain details:    Quality:  Aching   Severity:  Moderate   Duration:  3 days   Progression:  Worsening Relieved by:  Nothing Worsened by:  Nothing Ineffective treatments:  None tried Risk factors: prior abscess     History reviewed. No pertinent past medical history.  There are no active problems to display for this patient.   History reviewed. No pertinent surgical history.     Home Medications    Prior to Admission medications   Medication Sig Start Date End Date Taking? Authorizing Provider  amoxicillin (AMOXIL) 500 MG capsule Take 1 capsule (500 mg total) by mouth 3 (three) times daily. 10/25/14   Ivery QualeHobson Bryant, PA-C  aspirin-acetaminophen-caffeine (EXCEDRIN MIGRAINE) [phone] MG per tablet Take 2 tablets by mouth every 6 (six) hours as needed (pain).    Historical Provider, MD  ibuprofen (ADVIL,MOTRIN) 800 MG tablet Take 1 tablet (800 mg total) by mouth 3 (three) times daily. 10/25/14   Ivery QualeHobson Bryant, PA-C    Family History No family history on file.  Social History Social History  Substance Use Topics  . Smoking status: Current Every Day Smoker    Packs/day: 1.00    Types: Cigarettes  . Smokeless tobacco: Never Used  . Alcohol use No     Allergies   Patient has no known allergies.   Review of Systems Review of Systems  Skin: Positive for color change.  All other systems reviewed and are negative.    Physical Exam Updated Vital Signs BP 145/92 (BP Location: Left Arm)   Pulse 104    Temp 98.6 F (37 C) (Oral)   Resp 16   Ht 5\' 4"  (1.626 m)   Wt 72.6 kg   SpO2 100%   BMI 27.46 kg/m   Physical Exam  Constitutional: He appears well-developed and well-nourished.  Musculoskeletal: Normal range of motion.  Neurological: He is alert.  Skin: There is erythema.  3cm swollen area left buttock   Psychiatric: He has a normal mood and affect.  Nursing note and vitals reviewed.    ED Treatments / Results  Labs (all labs ordered are listed, but only abnormal results are displayed) Labs Reviewed - No data to display  EKG  EKG Interpretation None       Radiology No results found.  Procedures .Marland Kitchen.Incision and Drainage Date/Time: 07/02/2016 6:08 PM Performed by: Elson AreasSOFIA, LESLIE K Authorized by: Elson AreasSOFIA, LESLIE K   Consent:    Consent obtained:  Verbal   Consent given by:  Patient   Risks discussed:  Bleeding Location:    Type:  Abscess   Size:  2   Location:  Anogenital Pre-procedure details:    Skin preparation:  Betadine Anesthesia (see MAR for exact dosages):    Anesthesia method:  Local infiltration   Local anesthetic:  Lidocaine 2% WITH epi Procedure type:    Complexity:  Simple Procedure details:    Needle aspiration: no     Incision types:  Stab  incision and cruciate   Scalpel blade:  11   Drainage amount:  Moderate   Wound treatment:  Wound left open Post-procedure details:    Patient tolerance of procedure:  Tolerated well, no immediate complications Comments:     X shaped incision, wound irrigated,     (including critical care time)  Medications Ordered in ED Medications  lidocaine (XYLOCAINE) 2 % injection 10 mL (10 mLs Infiltration Given by Other 07/02/16 1800)     Initial Impression / Assessment and Plan / ED Course  I have reviewed the triage vital signs and the nursing notes.  Pertinent labs & imaging results that were available during my care of the patient were reviewed by me and considered in my medical decision making (see  chart for details).  Clinical Course       Final Clinical Impressions(s) / ED Diagnoses   Final diagnoses:  Abscess    New Prescriptions New Prescriptions   HYDROCODONE-ACETAMINOPHEN (NORCO/VICODIN) 5-325 MG TABLET    Take 2 tablets by mouth every 4 (four) hours as needed.   SULFAMETHOXAZOLE-TRIMETHOPRIM (BACTRIM DS,SEPTRA DS) 800-160 MG TABLET    Take 1 tablet by mouth 2 (two) times daily.     Lonia SkinnerLeslie K Channel Islands BeachS[place], PA-C 07/02/16 1812    Pricilla LovelessScott Goldston, MD 07/08/16 [phone]

## 2018-05-12 ENCOUNTER — Encounter (HOSPITAL_COMMUNITY): Payer: Self-pay | Admitting: Emergency Medicine

## 2018-05-12 ENCOUNTER — Emergency Department (HOSPITAL_COMMUNITY)
Admission: EM | Admit: 2018-05-12 | Discharge: 2018-05-12 | Disposition: A | Discharge status: Home / Self Care | Payer: BLUE CROSS/BLUE SHIELD | Attending: Emergency Medicine | Admitting: Emergency Medicine

## 2018-05-12 ENCOUNTER — Other Ambulatory Visit: Payer: Self-pay

## 2018-05-12 DIAGNOSIS — F17210 Nicotine dependence, cigarettes, uncomplicated: Secondary | ICD-10-CM | POA: Insufficient documentation

## 2018-05-12 DIAGNOSIS — L02214 Cutaneous abscess of groin: Secondary | ICD-10-CM | POA: Diagnosis present

## 2018-05-12 DIAGNOSIS — L0291 Cutaneous abscess, unspecified: Secondary | ICD-10-CM

## 2018-05-12 MED ORDER — HYDROCODONE-ACETAMINOPHEN 5-325 MG PO TABS
1.0000 | ORAL_TABLET | Freq: Once | ORAL | Status: AC
  Administered 2018-05-12: 1 via ORAL
  Filled 2018-05-12: qty 1

## 2018-05-12 MED ORDER — SULFAMETHOXAZOLE-TRIMETHOPRIM 800-160 MG PO TABS: 1.0000 | ORAL_TABLET | Freq: Two times a day (BID) | ORAL | 0 refills | Status: AC

## 2018-05-12 MED ORDER — LIDOCAINE HCL (PF) 1 % IJ SOLN
5.0000 mL | Freq: Once | INTRAMUSCULAR | Status: AC
  Administered 2018-05-12: 5 mL via INTRADERMAL
  Filled 2018-05-12: qty 6

## 2018-05-12 MED ORDER — SULFAMETHOXAZOLE-TRIMETHOPRIM 800-160 MG PO TABS
1.0000 | ORAL_TABLET | Freq: Once | ORAL | Status: AC
  Administered 2018-05-12: 1 via ORAL
  Filled 2018-05-12: qty 1

## 2018-05-12 NOTE — ED Triage Notes (Signed)
Pt c/o of abscess on right groin area x 1 week with swelling and pain.  No drainage.

## 2018-05-12 NOTE — Discharge Instructions (Signed)
As discussed, it is important that you monitor your condition carefully, keep your wound clean and dry. Please use Epson salt baths, twice daily, and warm compresses 3 times daily to facilitate healing. Return here for concerning changes in your condition.

## 2018-05-12 NOTE — ED Notes (Signed)
Dr L in to I&D

## 2018-05-12 NOTE — ED Provider Notes (Signed)
[facility] EMERGENCY DEPARTMENT Provider Note   CSN: [id number] Arrival date & time: 05/12/18  1051     History   Chief Complaint Chief Complaint  Patient presents with  . Abscess    HPI Eric Meyer is a 37 y.o. male.  HPI Patient presents with concern of pain and swelling in his right inguinal crease. Patient was about 4 days ago he noticed swelling, and subsequently developed increasing erythema, severe sharp pain in the inguinal area. No scrotal or penis pain, no dysuria, hematuria. No nausea, vomiting, fever, chills. No relief with anything, and the pain is increased, prompting evaluation. Patient states that he is generally well, takes no medication regularly. He has had abscesses previously.  History reviewed. No pertinent past medical history.  There are no active problems to display for this patient.   History reviewed. No pertinent surgical history.      Home Medications    Prior to Admission medications   Medication Sig Start Date End Date Taking? Authorizing Provider  aspirin-acetaminophen-caffeine (EXCEDRIN MIGRAINE) [phone] MG per tablet Take 2 tablets by mouth every 6 (six) hours as needed (pain).    [provider]  HYDROcodone-acetaminophen (NORCO/VICODIN) 5-325 MG tablet Take 2 tablets by mouth every 4 (four) hours as needed. 07/02/16   Elson Areas, PA-C  ibuprofen (ADVIL,MOTRIN) 800 MG tablet Take 1 tablet (800 mg total) by mouth 3 (three) times daily. 10/25/14   Ivery Quale, PA-C  sulfamethoxazole-trimethoprim (BACTRIM DS,SEPTRA DS) 800-160 MG tablet Take 1 tablet by mouth 2 (two) times daily for 7 days. 05/12/18 05/19/18  Gerhard Munch, MD    Family History History reviewed. No pertinent family history.  Social History Social History   Tobacco Use  . Smoking status: Current Every Day Smoker    Packs/day: 1.00    Types: Cigarettes  . Smokeless tobacco: Never Used  Substance Use Topics  . Alcohol use: No  . Drug  use: No     Allergies   Patient has no known allergies.   Review of Systems Review of Systems  Constitutional:       Per HPI, otherwise negative  HENT:       Per HPI, otherwise negative  Respiratory:       Per HPI, otherwise negative  Cardiovascular:       Per HPI, otherwise negative  Gastrointestinal: Negative for vomiting.  Endocrine:       Negative aside from HPI  Genitourinary:       Neg aside from HPI   Musculoskeletal:       Per HPI, otherwise negative  Skin: Positive for color change and wound.  Neurological: Negative for syncope.     Physical Exam Updated Vital Signs BP (!) 155/95 (BP Location: Right Arm)   Pulse (!) 112   Temp 99.2 F (37.3 C) (Oral)   Resp 18   Ht 5\' 3"  (1.6 m)   Wt 74.8 kg   SpO2 100%   BMI 29.23 kg/m   Physical Exam  Constitutional: He is oriented to person, place, and time. He appears well-developed. No distress.  HENT:  Head: Normocephalic and atraumatic.  Eyes: Conjunctivae and EOM are normal.  Cardiovascular: Normal rate and regular rhythm.  Pulmonary/Chest: Effort normal. No stridor. No respiratory distress.  Abdominal: He exhibits no distension.  Genitourinary: Penis normal.     Musculoskeletal: He exhibits no edema.  Neurological: He is alert and oriented to person, place, and time.  Skin: Skin is warm and dry.  Psychiatric:  He has a normal mood and affect.  Nursing note and vitals reviewed.    ED Treatments / Results   Procedures .Marland KitchenIncision and Drainage Date/Time: 05/12/2018 2:00 PM Performed by: Gerhard Munch, MD Authorized by: Gerhard Munch, MD   Consent:    Consent obtained:  Verbal   Consent given by:  Patient   Risks discussed:  Bleeding, infection, incomplete drainage and pain   Alternatives discussed:  Alternative treatment Location:    Type:  Abscess   Size:  3   Location:  Trunk   Trunk location:  Abdomen Pre-procedure details:    Skin preparation:  Chloraprep Anesthesia (see MAR for  exact dosages):    Anesthesia method:  Local infiltration   Local anesthetic:  Lidocaine 1% w/o epi Procedure type:    Complexity:  Complex Procedure details:    Needle aspiration: no     Incision types:  Single straight   Incision depth:  Subcutaneous   Scalpel blade:  11   Wound management:  Probed and deloculated, irrigated with saline and extensive cleaning   Drainage:  Bloody and purulent   Drainage amount:  Moderate   Wound treatment:  Wound left open   Packing materials:  None Comments:     After ring block was performed, the patient had incision, with breaking up of loculated area, saline irrigation, decreased size of the lesion.   (including critical care time)  Medications Ordered in ED Medications  sulfamethoxazole-trimethoprim (BACTRIM DS,SEPTRA DS) 800-160 MG per tablet 1 tablet (has no administration in time range)  HYDROcodone-acetaminophen (NORCO/VICODIN) 5-325 MG per tablet 1 tablet (1 tablet Oral Given 05/12/18 1250)  lidocaine (PF) (XYLOCAINE) 1 % injection 5 mL (5 mLs Intradermal Given 05/12/18 1250)     Initial Impression / Assessment and Plan / ED Course  I have reviewed the triage vital signs and the nursing notes.  Pertinent labs & imaging results that were available during my care of the patient were reviewed by me and considered in my medical decision making (see chart for details).  Otherwise well-appearing male presents with right inguinal crease lesion consistent with abscess. Abscess had successful incision and drainage. Some suspicion for adenopathy deep to the wound, lower suspicion for cyst, though this remains a possibility. Patient started on antibiotics, home care regimen, will follow-up as needed.   Final Clinical Impressions(s) / ED Diagnoses   Final diagnoses:  Abscess    ED Discharge Orders         Ordered    sulfamethoxazole-trimethoprim (BACTRIM DS,SEPTRA DS) 800-160 MG tablet  2 times daily     05/12/18 1358             Gerhard Munch, MD 05/12/18 1402
